# Patient Record
Sex: Male | Born: 1975 | Race: White | Hispanic: No | Marital: Single | Smoking: Current every day smoker
Health system: Southern US, Community
[De-identification: ages and names within clinical notes are randomized; demographics above are authoritative.]

## PROBLEM LIST (undated history)

## (undated) DIAGNOSIS — M1990 Unspecified osteoarthritis, unspecified site: Secondary | ICD-10-CM

## (undated) DIAGNOSIS — D751 Secondary polycythemia: Secondary | ICD-10-CM

## (undated) DIAGNOSIS — I10 Essential (primary) hypertension: Secondary | ICD-10-CM

---

## 2016-12-30 ENCOUNTER — Encounter (HOSPITAL_COMMUNITY): Payer: Self-pay | Admitting: Emergency Medicine

## 2016-12-30 ENCOUNTER — Emergency Department (HOSPITAL_COMMUNITY): Payer: Self-pay

## 2016-12-30 ENCOUNTER — Emergency Department (HOSPITAL_COMMUNITY)
Admission: EM | Admit: 2016-12-30 | Discharge: 2016-12-30 | Disposition: A | Discharge status: Home / Self Care | Payer: Self-pay | Attending: Emergency Medicine | Admitting: Emergency Medicine

## 2016-12-30 DIAGNOSIS — R519 Headache, unspecified: Secondary | ICD-10-CM

## 2016-12-30 DIAGNOSIS — I10 Essential (primary) hypertension: Secondary | ICD-10-CM | POA: Insufficient documentation

## 2016-12-30 DIAGNOSIS — F17200 Nicotine dependence, unspecified, uncomplicated: Secondary | ICD-10-CM | POA: Insufficient documentation

## 2016-12-30 DIAGNOSIS — R51 Headache: Secondary | ICD-10-CM | POA: Insufficient documentation

## 2016-12-30 HISTORY — DX: Essential (primary) hypertension: I10

## 2016-12-30 HISTORY — DX: Unspecified osteoarthritis, unspecified site: M19.90

## 2016-12-30 HISTORY — DX: Secondary polycythemia: D75.1

## 2016-12-30 LAB — URINALYSIS, ROUTINE W REFLEX MICROSCOPIC
Bacteria, UA: NONE SEEN
Bilirubin Urine: NEGATIVE
GLUCOSE, UA: NEGATIVE mg/dL
Ketones, ur: NEGATIVE mg/dL
LEUKOCYTES UA: NEGATIVE
NITRITE: NEGATIVE
Protein, ur: 30 mg/dL — AB
SPECIFIC GRAVITY, URINE: 1.017 (ref 1.005–1.030)
Squamous Epithelial / LPF: NONE SEEN
pH: 6 (ref 5.0–8.0)

## 2016-12-30 LAB — BASIC METABOLIC PANEL
Anion gap: 7 (ref 5–15)
BUN: 15 mg/dL (ref 6–20)
CALCIUM: 8.9 mg/dL (ref 8.9–10.3)
CO2: 25 mmol/L (ref 22–32)
CREATININE: 0.84 mg/dL (ref 0.61–1.24)
Chloride: 103 mmol/L (ref 101–111)
GFR calc Af Amer: >60 mL/min (ref >60)
Glucose, Bld: 102 mg/dL — ABNORMAL HIGH (ref 65–99)
POTASSIUM: 3.6 mmol/L (ref 3.5–5.1)
SODIUM: 135 mmol/L (ref 135–145)

## 2016-12-30 LAB — RAPID URINE DRUG SCREEN, HOSP PERFORMED
AMPHETAMINES: NOT DETECTED
BENZODIAZEPINES: NOT DETECTED
Barbiturates: NOT DETECTED
Cocaine: NOT DETECTED
Opiates: NOT DETECTED
TETRAHYDROCANNABINOL: NOT DETECTED

## 2016-12-30 LAB — CBC
HEMATOCRIT: 46.9 % (ref 39.0–52.0)
Hemoglobin: 16.8 g/dL (ref 13.0–17.0)
MCH: 32.1 pg (ref 26.0–34.0)
MCHC: 35.8 g/dL (ref 30.0–36.0)
MCV: 89.7 fL (ref 78.0–100.0)
PLATELETS: 200 10*3/uL (ref 150–400)
RBC: 5.23 MIL/uL (ref 4.22–5.81)
RDW: 13.9 % (ref 11.5–15.5)
WBC: 7 10*3/uL (ref 4.0–10.5)

## 2016-12-30 MED ORDER — BENZONATATE 100 MG PO CAPS: 100.0000 mg | ORAL_CAPSULE | Freq: Three times a day (TID) | ORAL | 0 refills | Status: AC | PRN

## 2016-12-30 NOTE — ED Provider Notes (Signed)
[facility]   CSN: [id number] Arrival date & time: 12/30/16  0049     History   Chief Complaint Chief Complaint  Patient presents with  . Headache    HPI Aaron Maxwell is a 41 y.o. male.  41 year old male with a history of hypertension and polycythemia vera presents to the emergency department for evaluation of a headache. Patient reports a left parietal occipital headache which has been fairly constant over the past 5 days. Pain associated with nausea and photophobia; some sporadic dizziness. He has taken ibuprofen for symptoms with some relief. Pain will radiate to his left neck at times. No recent head injuries or trauma, though patient does report coughing a lot recently and thinks he may have pulled a muscle in his neck contributing to his pain. He expresses concern about his headache given his hx of polycythemia, stating that he knows this may make him prone to clots. He denies a hx of fever, vision loss, hearing changes, vomiting, extremity numbness/paresthesias, extremity weakness, inability to ambulate. He was out of his Norvasc up until 3 days ago and has had it refilled x 2 days. He has been compliant with his 325mg  ASA QD.   The history is provided by the patient. No language interpreter was used.  Headache      Past Medical History:  Diagnosis Date  . Arthritis   . Hypertension   . Polycythemia     There are no active problems to display for this patient.   History reviewed. No pertinent surgical history.     Home Medications    Prior to Admission medications   Medication Sig Start Date End Date Taking? Authorizing Provider  benzonatate (TESSALON) 100 MG capsule Take 1-2 capsules (100-200 mg total) by mouth 3 (three) times daily as needed for cough. 12/30/16   Antony MaduraHumes, Gedeon Brandow, PA-C    Family History No family history on file.  Social History Social History  Substance Use Topics  . Smoking status:  Current Every Day Smoker    Packs/day: 0.50  . Smokeless tobacco: Never Used  . Alcohol use No     Allergies   Penicillins   Review of Systems Review of Systems  Neurological: Positive for headaches.  Ten systems reviewed and are negative for acute change, except as noted in the HPI.    Physical Exam Updated Vital Signs BP 119/86 (BP Location: Right Arm)   Pulse 63   Temp 98.1 F (36.7 C) (Oral)   Resp 12   Ht 5\' 9"  (1.753 m)   Wt 88.5 kg (195 lb)   SpO2 97%   BMI 28.80 kg/m   Physical Exam  Constitutional: He is oriented to person, place, and time. He appears well-developed and well-nourished. No distress.  Nontoxic and in NAD  HENT:  Head: Normocephalic and atraumatic.  Mouth/Throat: Oropharynx is clear and moist.  Tongue midline. Symmetric rise of the uvula with phonation.  Eyes: Pupils are equal, round, and reactive to light. Conjunctivae and EOM are normal. No scleral icterus.  Neck: Normal range of motion.  No meningismus  Cardiovascular: Normal rate, regular rhythm and intact distal pulses.   Pulmonary/Chest: Effort normal. No respiratory distress.  Respirations even and unlabored  Musculoskeletal: Normal range of motion.  Neurological: He is alert and oriented to person, place, and time. No cranial nerve deficit. He exhibits normal muscle tone. Coordination normal.  GCS 15. Speech is goal oriented. No cranial nerve deficits appreciated; symmetric eyebrow  raise, no facial drooping, tongue midline. Patient has equal grip strength bilaterally with 5/5 strength against resistance in all major muscle groups bilaterally. Sensation to light touch intact. Patient moves extremities without ataxia. Patient ambulatory with steady gait.  Skin: Skin is warm and dry. No rash noted. He is not diaphoretic. No erythema. No pallor.  Psychiatric: He has a normal mood and affect. His behavior is normal.  Nursing note and vitals reviewed.    ED Treatments / Results  Labs (all  labs ordered are listed, but only abnormal results are displayed) Labs Reviewed  BASIC METABOLIC PANEL - Abnormal; Notable for the following:       Result Value   Glucose, Bld 102 (*)    All other components within normal limits  URINALYSIS, ROUTINE W REFLEX MICROSCOPIC - Abnormal; Notable for the following:    Hgb urine dipstick MODERATE (*)    Protein, ur 30 (*)    All other components within normal limits  CBC  RAPID URINE DRUG SCREEN, HOSP PERFORMED    EKG  EKG Interpretation None       Radiology Ct Head Wo Contrast  Result Date: 12/30/2016 CLINICAL DATA:  Posterior and left-sided headache for 5 days. Nausea and dizziness. Normal neurological examination. EXAM: CT HEAD WITHOUT CONTRAST TECHNIQUE: Contiguous axial images were obtained from the base of the skull through the vertex without intravenous contrast. COMPARISON:  None. FINDINGS: Brain: No evidence of acute infarction, hemorrhage, hydrocephalus, extra-axial collection or mass lesion/mass effect. Vascular: No hyperdense vessel or unexpected calcification. Skull: Normal. Negative for fracture or focal lesion. Sinuses/Orbits: No acute finding. Other: None. IMPRESSION: No acute intracranial abnormalities. Electronically Signed   By: Burman Nieves M.D.   On: 12/30/2016 01:57    Procedures Procedures (including critical care time)  Medications Ordered in ED Medications - No data to display   Initial Impression / Assessment and Plan / ED Course  I have reviewed the triage vital signs and the nursing notes.  Pertinent labs & imaging results that were available during my care of the patient were reviewed by me and considered in my medical decision making (see chart for details).     41 year old male presents to the emergency department for evaluation of headache 5 days. Symptoms associated with nausea and photosensitivity. Patient expresses concern for stroke given history of polycythemia, stating that he is aware this  makes him more prone to clotting. He has been compliant with his daily aspirin regimen. No hx of head injury or trauma.  Patient with a reassuring workup today and nonfocal neurologic exam. No fever, nuchal rigidity, or meningis to suggest meningitis. Laboratory work up is of further reassurance with negative head CT. No evidence of CVA, hemorrhage, mass, hydrocephalus. Neurologic exam and hx also do not lend themselves to underlying CVA or TIA as cause of symptoms.  I do not believe further emergent work up is indicated at this time. Patient declines additional medication stating that he has had some improvement in his headache without ibuprofen. He wishes to continue this outpatient. He does request medicine for cough; tessalon prescribed. Return precautions discussed and provided. Patient discharged in stable condition with no unaddressed concerns.   Final Clinical Impressions(s) / ED Diagnoses   Final diagnoses:  Nonintractable headache, unspecified chronicity pattern, unspecified headache type    New Prescriptions New Prescriptions   BENZONATATE (TESSALON) 100 MG CAPSULE    Take 1-2 capsules (100-200 mg total) by mouth 3 (three) times daily as needed for cough.  Antony Madura, PA-C 12/30/16 1610    Zadie Rhine, MD 12/30/16 [phone]

## 2016-12-30 NOTE — ED Triage Notes (Signed)
Pt reports HA X5 days with nausea and some photosensitivity. Pt states pain is in back of head radiating into neck. No neuro deficits, A/XO4, ambulatory.

## 2018-08-28 IMAGING — CT CT HEAD W/O CM
3 series · 15 of 47 positions shown, 18 images · non-contrast
Comparison: None.

CLINICAL DATA: Posterior and left-sided headache for 5 days. Nausea
and dizziness. Normal neurological examination.

EXAM:
CT HEAD WITHOUT CONTRAST
TECHNIQUE: Contiguous axial images were obtained from the base of the skull
through the vertex without intravenous contrast.

[Series 3: head 5.0 h30s · axial · 0.43mm/px · z∈[+1078,+1218]mm · 9 of 34 slices shown, 12 images]
[im 3/34  brain]
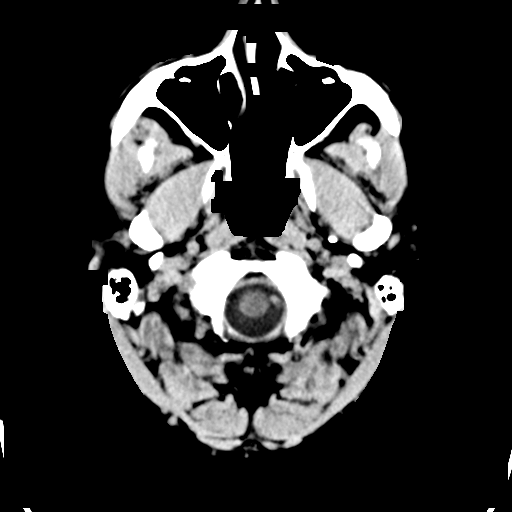
[im 3/34  bone]
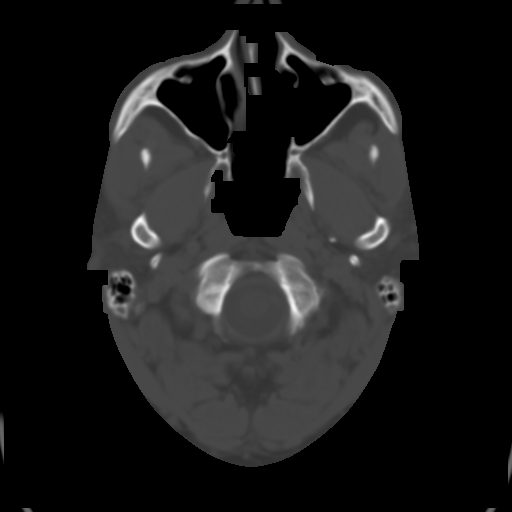
[im 6/34  brain]
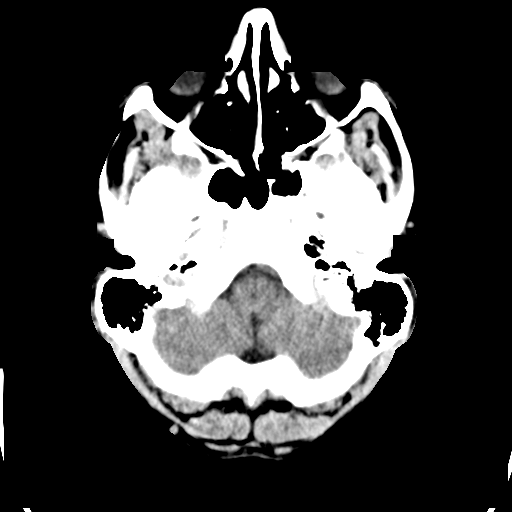
[im 10/34  brain]
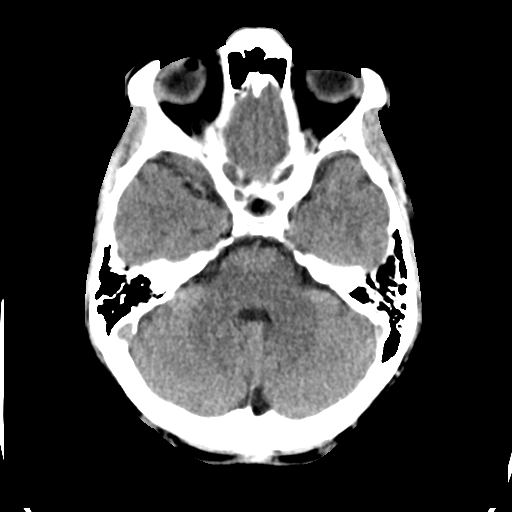
[im 13/34  brain]
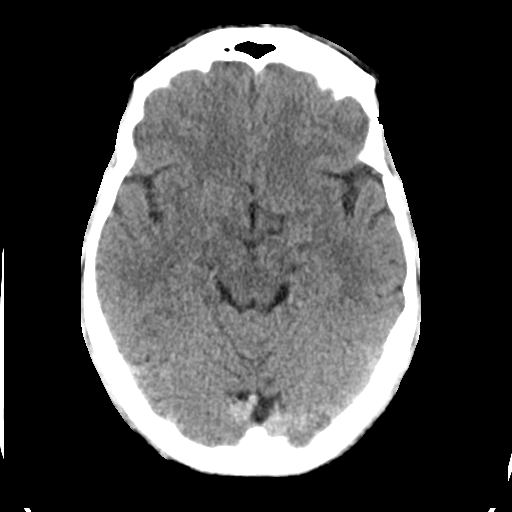
[im 18/34  brain]
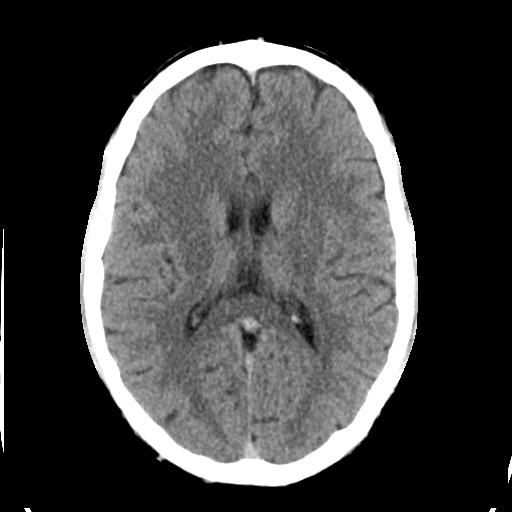
[im 18/34  bone]
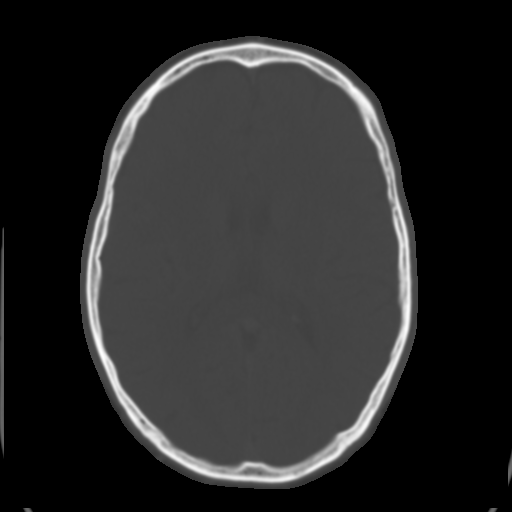
[im 21/34  brain]
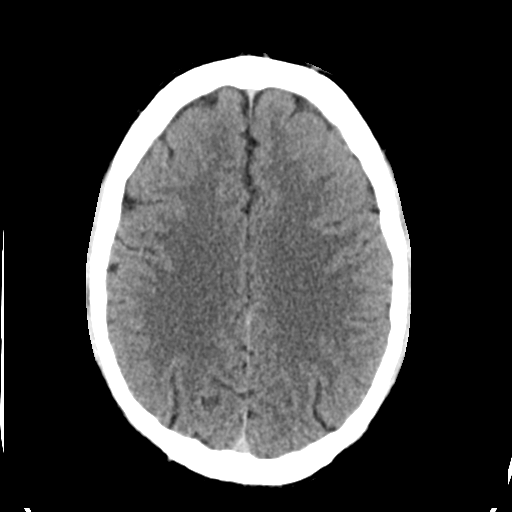
[im 24/34  brain]
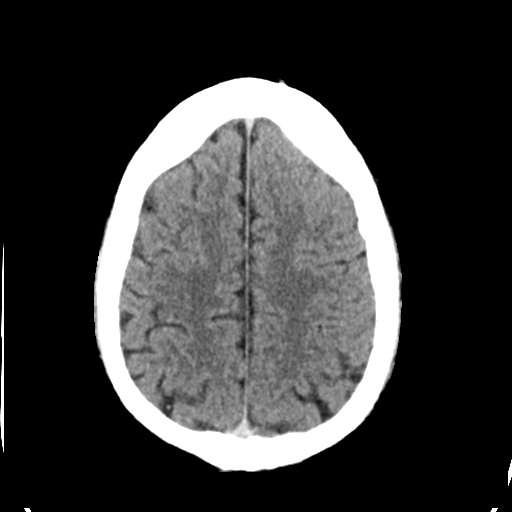
[im 28/34  brain]
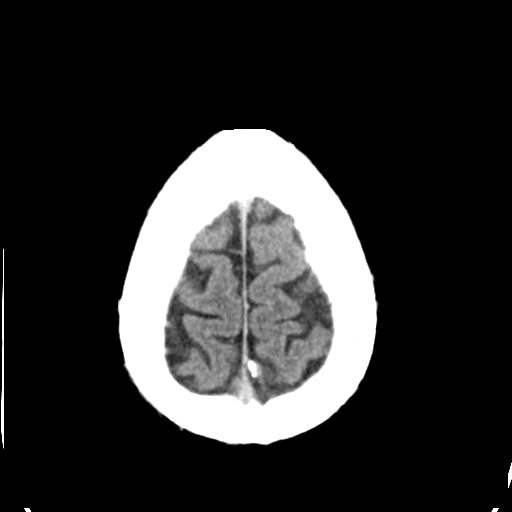
[im 31/34  brain]
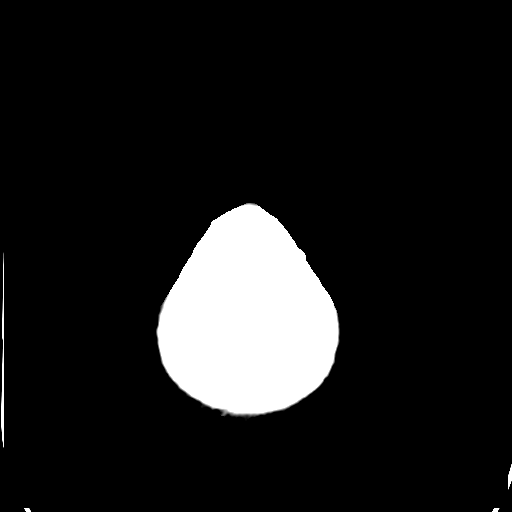
[im 31/34  bone]
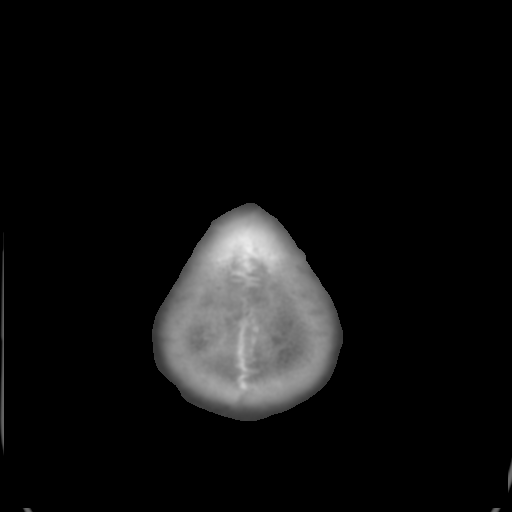

[Series 5: head 3.0 mpr cor · coronal · 0.33mm/px · 3 of 72 slices shown]
[im 24/72  brain]
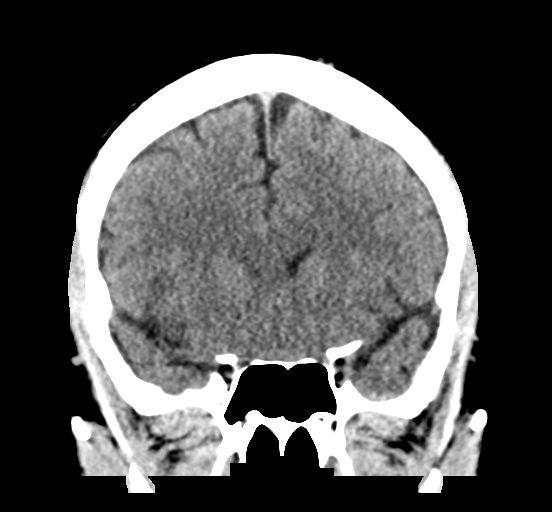
[im 32/72  brain]
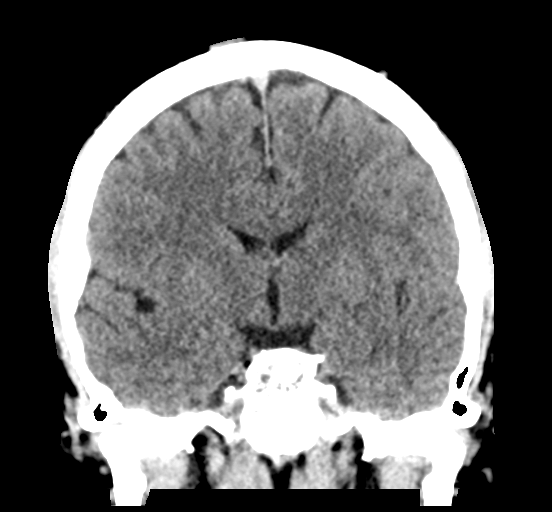
[im 40/72  brain]
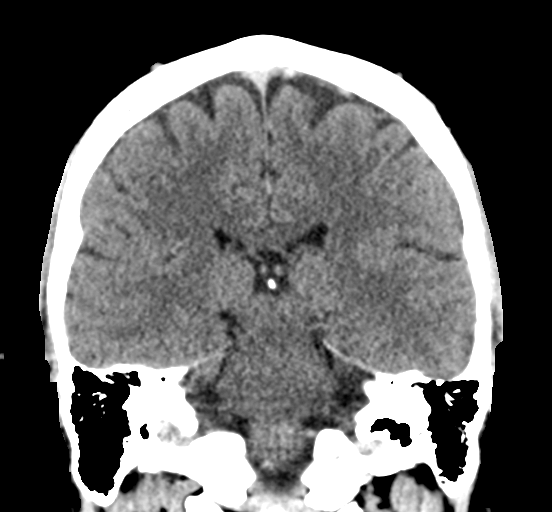

[Series 6: head 3.0 mpr sag · sagittal · 0.33mm/px · 3 of 63 slices shown]
[im 21/63  brain]
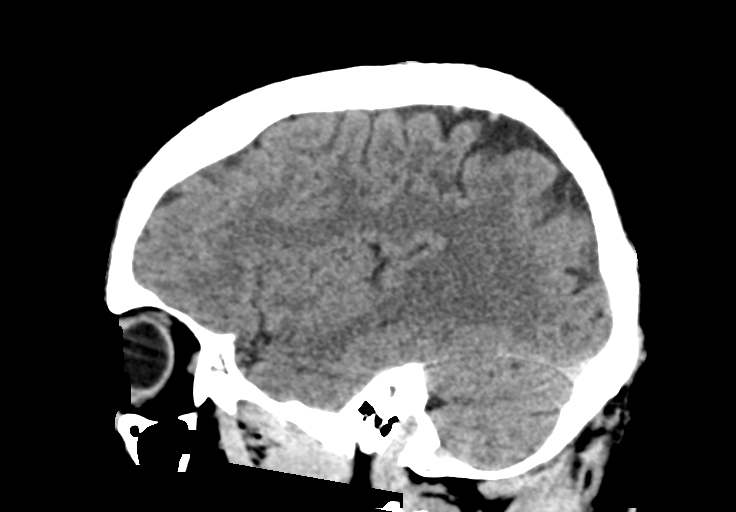
[im 32/63  brain]
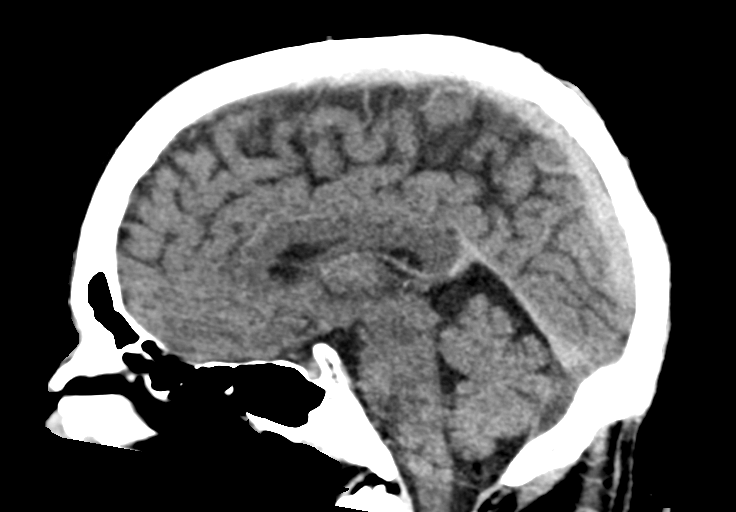
[im 42/63  brain]
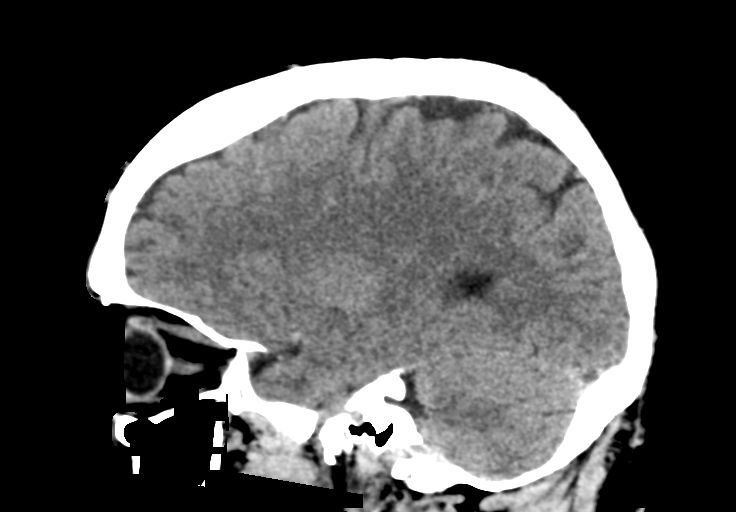

[15 of 47 positions shown; findings below may reference images not displayed]

FINDINGS: Brain: No evidence of acute infarction, hemorrhage, hydrocephalus,
extra-axial collection or mass lesion/mass effect.

Vascular: No hyperdense vessel or unexpected calcification.

Skull: Normal. Negative for fracture or focal lesion.

Sinuses/Orbits: No acute finding.

Other: None.
IMPRESSION: No acute intracranial abnormalities.
